# Patient Record
Sex: Male | Born: 1985 | Race: White | Hispanic: No | Marital: Married | Smoking: Never smoker
Health system: Southern US, Community
[De-identification: ages and names within clinical notes are randomized; demographics above are authoritative.]

## PROBLEM LIST (undated history)

## (undated) DIAGNOSIS — D6859 Other primary thrombophilia: Secondary | ICD-10-CM

## (undated) HISTORY — PX: FOOT SURGERY: SHX648

---

## 2016-08-13 ENCOUNTER — Emergency Department (HOSPITAL_BASED_OUTPATIENT_CLINIC_OR_DEPARTMENT_OTHER): Payer: Worker's Compensation

## 2016-08-13 ENCOUNTER — Encounter (HOSPITAL_BASED_OUTPATIENT_CLINIC_OR_DEPARTMENT_OTHER): Payer: Self-pay | Admitting: Emergency Medicine

## 2016-08-13 ENCOUNTER — Emergency Department (HOSPITAL_BASED_OUTPATIENT_CLINIC_OR_DEPARTMENT_OTHER)
Admission: EM | Admit: 2016-08-13 | Discharge: 2016-08-13 | Disposition: A | Discharge status: Home / Self Care | Payer: Worker's Compensation | Attending: Emergency Medicine | Admitting: Emergency Medicine

## 2016-08-13 DIAGNOSIS — Z23 Encounter for immunization: Secondary | ICD-10-CM | POA: Insufficient documentation

## 2016-08-13 DIAGNOSIS — S62652A Nondisplaced fracture of medial phalanx of right middle finger, initial encounter for closed fracture: Secondary | ICD-10-CM | POA: Diagnosis not present

## 2016-08-13 DIAGNOSIS — Y990 Civilian activity done for income or pay: Secondary | ICD-10-CM | POA: Insufficient documentation

## 2016-08-13 DIAGNOSIS — S62652B Nondisplaced fracture of medial phalanx of right middle finger, initial encounter for open fracture: Secondary | ICD-10-CM

## 2016-08-13 DIAGNOSIS — W231XXA Caught, crushed, jammed, or pinched between stationary objects, initial encounter: Secondary | ICD-10-CM | POA: Diagnosis not present

## 2016-08-13 DIAGNOSIS — Y939 Activity, unspecified: Secondary | ICD-10-CM | POA: Diagnosis not present

## 2016-08-13 DIAGNOSIS — Y929 Unspecified place or not applicable: Secondary | ICD-10-CM | POA: Insufficient documentation

## 2016-08-13 DIAGNOSIS — S67192A Crushing injury of right middle finger, initial encounter: Secondary | ICD-10-CM | POA: Diagnosis present

## 2016-08-13 DIAGNOSIS — S6710XA Crushing injury of unspecified finger(s), initial encounter: Secondary | ICD-10-CM

## 2016-08-13 HISTORY — DX: Other primary thrombophilia: D68.59

## 2016-08-13 MED ORDER — CEPHALEXIN 500 MG PO CAPS: 500.0000 mg | ORAL_CAPSULE | Freq: Four times a day (QID) | ORAL | 0 refills | Status: AC

## 2016-08-13 MED ORDER — LIDOCAINE HCL (PF) 1 % IJ SOLN
5.0000 mL | Freq: Once | INTRAMUSCULAR | Status: AC
  Administered 2016-08-13: 5 mL via INTRADERMAL

## 2016-08-13 MED ORDER — HYDROCODONE-ACETAMINOPHEN 5-325 MG PO TABS: 1.0000 | ORAL_TABLET | Freq: Four times a day (QID) | ORAL | 0 refills | Status: AC | PRN

## 2016-08-13 MED ORDER — CEFAZOLIN IN D5W 1 GM/50ML IV SOLN
1.0000 g | Freq: Once | INTRAVENOUS | Status: AC
  Administered 2016-08-13: 1 g via INTRAVENOUS
  Filled 2016-08-13: qty 50

## 2016-08-13 MED ORDER — TETANUS-DIPHTH-ACELL PERTUSSIS 5-2.5-18.5 LF-MCG/0.5 IM SUSP
0.5000 mL | Freq: Once | INTRAMUSCULAR | Status: AC
  Administered 2016-08-13: 0.5 mL via INTRAMUSCULAR
  Filled 2016-08-13: qty 0.5

## 2016-08-13 MED ORDER — LIDOCAINE HCL (PF) 1 % IJ SOLN
INTRAMUSCULAR | Status: AC
  Filled 2016-08-13: qty 5

## 2016-08-13 NOTE — ED Notes (Signed)
Patient transported to X-ray 

## 2016-08-13 NOTE — ED Triage Notes (Signed)
Right middle finger crushed by a gun safe while pt was at work.

## 2016-08-13 NOTE — ED Provider Notes (Signed)
[facility] Provider Note: Lowella DellJ. Lane Joda Braatz, MD, FACEP  CSN: [id number] MRN: [id number] ARRIVAL: 08/13/16 at 0205 ROOM: [facility]   CHIEF COMPLAINT  Finger Injury   HISTORY OF PRESENT ILLNESS  Jeffrey Schwartz is a 30 y.o. male who got his right middle finger crushed by a gun safe at work just prior to arrival. There is a laceration to the volar aspect of the right middle finger with sloughing of epidermis dorsally. He denies significant pain stating the pain feels more numb at the present time; he has a history of frostbite with chronic decreased sensation of the fingers of that hand. He is able to flex and extend the right middle finger at the DIP and PIP joints. He is not sure of his tetanus status.   Past Medical History:  Diagnosis Date  . Protein S deficiency [facility](HCC)     Past Surgical History:  Procedure Laterality Date  . FOOT SURGERY      No family history on file.  Social History  Substance Use Topics  . Smoking status: Never Smoker  . Smokeless tobacco: Never Used  . Alcohol use Yes    Prior to Admission medications   Not on File    Allergies Codeine   REVIEW OF SYSTEMS  Negative except as noted here or in the History of Present Illness.   PHYSICAL EXAMINATION  Initial Vital Signs Blood pressure 149/72, pulse 77, temperature 98.1 F (36.7 C), resp. rate 18, height 5\' 10"  (1.778 m), weight 285 lb (129.3 kg), SpO2 100 %.  Examination General: Well-developed, well-nourished male in no acute distress; appearance consistent with age of record HENT: normocephalic; atraumatic Eyes: Normal appearance Neck: supple Heart: regular rate and rhythm Lungs: clear to auscultation bilaterally Abdomen: soft; nondistended; nontender; bowel sounds present Extremities: No deformity; full range of motion; pulses normal; laceration of volar right middle finger middle phalanx with sloughing of epidermis of the distal phalanx dorsally, DIP and PIP joint tendon  function intact, sensation decreased distally which the patient states is a chronic condition, distal capillary refill brisk:      Neurologic: Awake, alert and oriented; motor function intact in all extremities and symmetric; no facial droop Skin: Warm and dry Psychiatric: Normal mood and affect   RESULTS  Summary of this visit's results, reviewed by myself:   EKG Interpretation  Date/Time:    Ventricular Rate:    PR Interval:    QRS Duration:   QT Interval:    QTC Calculation:   R Axis:     Text Interpretation:        Laboratory Studies: No results found for this or any previous visit (from the past 24 hour(s)). Imaging Studies: Dg Finger Middle Right  Result Date: 08/13/2016 CLINICAL DATA:  Initial evaluation for acute injury, correction densities. EXAM: RIGHT MIDDLE FINGER 2+V COMPARISON:  None. FINDINGS: Soft tissue irregularity with swelling about the right third digit. Linear lucency traversing the mid aspect of the right third middle phalanx suspicious for acute nondisplaced fracture. Intra-articular extension into the right third PIP, and possibly the DIP joints. This is not well seen on lateral projection. The right third distal and proximal phalanges intact. IMPRESSION: 1. Longitudinal linear lucency traversing the mid aspect of the right third middle phalanx, suspicious for acute nondisplaced fracture. 2. Soft tissue injury with swelling at about the right third digit. Electronically Signed   By: Rise MuBenjamin  McClintock M.D.   On: 08/13/2016 02:34    ED COURSE  Nursing notes and initial  vitals signs, including pulse oximetry, reviewed.  Vitals:   08/13/16 0209 08/13/16 0210  BP: 149/72   Pulse: 77   Resp: 18   Temp: 98.1 F (36.7 C)   SpO2: 100%   Weight:  285 lb (129.3 kg)  Height:  5\' 10"  (1.778 m)   Ancef one gram IV given. Tetanus updated.  3:38 AM Discussed with Dr. Izora Ribasoley who will arrange to see patient in his office on follow-up.  PROCEDURES    LACERATION REPAIR Performed by: Hanley SeamenMOLPUS,Hakim Minniefield L Authorized by: Hanley SeamenMOLPUS,Brodie Scovell L Consent: Verbal consent obtained. Risks and benefits: risks, benefits and alternatives were discussed Consent given by: patient Patient identity confirmed: provided demographic data Prepped and Draped in normal sterile fashion Wound explored, no foreign bodies seen or palpated; no tendon injury seen or palpated  Laceration Location: Right middle finger  Laceration Length: 3.5 cm  Anesthesia: local infiltration  Local anesthetic: lidocaine 2 % without epinephrine  Anesthetic total: 5 ml  Irrigation method: syringe with Zero Wet  Amount of cleaning: Copious  Finger reprepped with Chlorascrub after irrigation   Skin closure: 3-0 Prolene   Number of sutures: 6   Technique: Simple interrupted  Tourniquet time: Approximately 25 minutes   Patient tolerance: Patient tolerated the procedure well with no immediate complications.      ED DIAGNOSES     ICD-9-CM ICD-10-CM   1. Open nondisplaced fracture of middle phalanx of right middle finger, initial encounter 816.11 S62.652B   2. Crushing injury of finger, initial encounter 927.3 S67.10XA        Paula LibraJohn Willard Madrigal, MD 08/13/16 [phone]
